# Patient Record
Sex: Female | Born: 1960
Health system: Southern US, Community
[De-identification: ages and names within clinical notes are randomized; demographics above are authoritative.]

## PROBLEM LIST (undated history)

## (undated) DIAGNOSIS — J45909 Unspecified asthma, uncomplicated: Secondary | ICD-10-CM

---

## 2017-06-04 ENCOUNTER — Other Ambulatory Visit: Payer: Self-pay

## 2017-06-04 ENCOUNTER — Emergency Department (HOSPITAL_BASED_OUTPATIENT_CLINIC_OR_DEPARTMENT_OTHER)
Admission: EM | Admit: 2017-06-04 | Discharge: 2017-06-04 | Disposition: A | Discharge status: Home / Self Care | Payer: 59 | Attending: Emergency Medicine | Admitting: Emergency Medicine

## 2017-06-04 ENCOUNTER — Encounter (HOSPITAL_BASED_OUTPATIENT_CLINIC_OR_DEPARTMENT_OTHER): Payer: Self-pay | Admitting: Emergency Medicine

## 2017-06-04 ENCOUNTER — Emergency Department (HOSPITAL_BASED_OUTPATIENT_CLINIC_OR_DEPARTMENT_OTHER): Payer: 59

## 2017-06-04 DIAGNOSIS — H6982 Other specified disorders of Eustachian tube, left ear: Secondary | ICD-10-CM | POA: Insufficient documentation

## 2017-06-04 DIAGNOSIS — J45909 Unspecified asthma, uncomplicated: Secondary | ICD-10-CM | POA: Insufficient documentation

## 2017-06-04 DIAGNOSIS — F17200 Nicotine dependence, unspecified, uncomplicated: Secondary | ICD-10-CM | POA: Diagnosis not present

## 2017-06-04 DIAGNOSIS — R51 Headache: Secondary | ICD-10-CM | POA: Diagnosis not present

## 2017-06-04 HISTORY — DX: Unspecified asthma, uncomplicated: J45.909

## 2017-06-04 LAB — CBC WITH DIFFERENTIAL/PLATELET
BASOS ABS: 0 10*3/uL (ref 0.0–0.1)
BASOS PCT: 1 %
EOS ABS: 0.2 10*3/uL (ref 0.0–0.7)
Eosinophils Relative: 3 %
HCT: 40 % (ref 36.0–46.0)
Hemoglobin: 13.4 g/dL (ref 12.0–15.0)
Lymphocytes Relative: 33 %
Lymphs Abs: 2.3 10*3/uL (ref 0.7–4.0)
MCH: 30.2 pg (ref 26.0–34.0)
MCHC: 33.5 g/dL (ref 30.0–36.0)
MCV: 90.3 fL (ref 78.0–100.0)
Monocytes Absolute: 0.6 10*3/uL (ref 0.1–1.0)
Monocytes Relative: 9 %
NEUTROS PCT: 54 %
Neutro Abs: 3.8 10*3/uL (ref 1.7–7.7)
Platelets: 271 10*3/uL (ref 150–400)
RBC: 4.43 MIL/uL (ref 3.87–5.11)
RDW: 13.7 % (ref 11.5–15.5)
WBC: 6.9 10*3/uL (ref 4.0–10.5)

## 2017-06-04 LAB — BASIC METABOLIC PANEL
Anion gap: 7 (ref 5–15)
BUN: 11 mg/dL (ref 6–20)
CHLORIDE: 103 mmol/L (ref 101–111)
CO2: 27 mmol/L (ref 22–32)
Calcium: 10.1 mg/dL (ref 8.9–10.3)
Creatinine, Ser: 0.66 mg/dL (ref 0.44–1.00)
GFR calc non Af Amer: >60 mL/min (ref >60)
Glucose, Bld: 78 mg/dL (ref 65–99)
POTASSIUM: 4.3 mmol/L (ref 3.5–5.1)
SODIUM: 137 mmol/L (ref 135–145)

## 2017-06-04 MED ORDER — SODIUM CHLORIDE 0.9 % IV BOLUS
1000.0000 mL | Freq: Once | INTRAVENOUS | Status: AC
  Administered 2017-06-04: 1000 mL via INTRAVENOUS

## 2017-06-04 MED ORDER — PREDNISONE 50 MG PO TABS: ORAL_TABLET | ORAL | 0 refills | Status: AC

## 2017-06-04 MED ORDER — PROCHLORPERAZINE EDISYLATE 5 MG/ML IJ SOLN
10.0000 mg | Freq: Once | INTRAMUSCULAR | Status: AC
  Administered 2017-06-04: 10 mg via INTRAVENOUS
  Filled 2017-06-04: qty 2

## 2017-06-04 MED ORDER — DEXAMETHASONE SODIUM PHOSPHATE 10 MG/ML IJ SOLN
10.0000 mg | Freq: Once | INTRAMUSCULAR | Status: AC
  Administered 2017-06-04: 10 mg via INTRAVENOUS
  Filled 2017-06-04: qty 1

## 2017-06-04 MED FILL — predniSONE 50 MG TABS: 50 | 5 days supply | Qty: 5 | Fill #0

## 2017-06-04 NOTE — Discharge Instructions (Addendum)
You can use Flonase nasal spray which is avilable over the counter. Use nasal saline (you can try [employer] Simply Saline) at least 4 times a day, use saline 5-10 minutes before using the fluticasone (flonase) nasal spray ° °Do not use Afrin (Oxymetazoline) ° °Rest, wash hands frequently  and drink plenty of water. ° °You may try over the counter medication such as allegra-decongestant or claritin-decongestant and/or Mucinex or decongestants. ° °Push fluids to try to thin the mucus and get it to flow out the nose.  ° °

## 2017-06-04 NOTE — ED Provider Notes (Signed)
[facility] EMERGENCY DEPARTMENT Provider Note   CSN: [id number] Arrival date & time: 06/04/17  4098     History   Chief Complaint Chief Complaint  Patient presents with  . Otalgia    HPI   Blood pressure (!) 142/82, pulse 91, temperature 98.7 F (37.1 C), temperature source Oral, resp. rate 18, height 5' (1.524 m), weight 72.6 kg (160 lb), last menstrual period 06/04/2016, SpO2 100 %.  Carol Marshall is a 57 y.o. female with past medical history significant for asthma, visiting her mother from [place] complaining of a headache onset 2 weeks ago she states that initially it was more occipital, it was indolent in onset with no thunderclap.  She states that she has had this pain for 2 weeks but she states that now it is more near the left ear and radiating down, she denies any fever, chills, change in vision, dysarthria, ataxia, rhinorrhea, nasal congestion, sore throat.  She does not typically have headaches, she is taking 800 mg of ibuprofen at home with little relief.  Past Medical History:  Diagnosis Date  . Asthma     There are no active problems to display for this patient.   History reviewed. No pertinent surgical history.   OB History   None      Home Medications    Prior to Admission medications   Medication Sig Start Date End Date Taking? Authorizing Provider  predniSONE (DELTASONE) 50 MG tablet Take 1 tablet daily with breakfast 06/04/17   Shakim Faith, Joni Reining, PA-C    Family History No family history on file.  Social History Social History   Tobacco Use  . Smoking status: Current Every Day Smoker  . Smokeless tobacco: Never Used  Substance Use Topics  . Alcohol use: Never    Frequency: Never  . Drug use: Never     Allergies   Patient has no known allergies.   Review of Systems Review of Systems  A complete review of systems was obtained and all systems are negative except as noted in the HPI and PMH.   Physical Exam Updated Vital  Signs BP (!) 142/82 (BP Location: Right Arm)   Pulse 91   Temp 98.7 F (37.1 C) (Oral)   Resp 18   Ht 5' (1.524 m)   Wt 72.6 kg (160 lb)   LMP 06/04/2016   SpO2 100%   BMI 31.25 kg/m   Physical Exam  Constitutional: She is oriented to person, place, and time. She appears well-developed and well-nourished.  HENT:  Head: Normocephalic and atraumatic.  Right Ear: External ear normal.  Left Ear: External ear normal.  Mouth/Throat: Oropharynx is clear and moist. No oropharyngeal exudate.  No drooling or stridor. Posterior pharynx mildly erythematous no significant tonsillar hypertrophy. No exudate. Soft palate rises symmetrically. No TTP or induration under tongue.   No tenderness to palpation of frontal or bilateral maxillary sinuses.  Mild mucosal edema in the nares with scant rhinorrhea.  Bilateral tympanic membranes with normal architecture and good light reflex.    Eyes: Pupils are equal, round, and reactive to light. Conjunctivae and EOM are normal.  No TTP of maxillary or frontal sinuses  No TTP or induration of temporal arteries bilaterally  Neck: Normal range of motion. Neck supple.  FROM to C-spine. Pt can touch chin to chest without discomfort. No TTP of midline cervical spine.   Cardiovascular: Normal rate, regular rhythm and intact distal pulses.  Pulmonary/Chest: Effort normal and breath sounds normal. No stridor.  No respiratory distress. She has no wheezes. She has no rales. She exhibits no tenderness.  Abdominal: Soft. Bowel sounds are normal. There is no tenderness. There is no rebound and no guarding.  Musculoskeletal: Normal range of motion. She exhibits no edema or tenderness.  Neurological: She is alert and oriented to person, place, and time. No cranial nerve deficit.  II-Visual fields grossly intact. III/IV/VI-Extraocular movements intact.  Pupils reactive bilaterally. V/VII-Smile symmetric, equal eyebrow raise,  facial sensation intact VIII- Hearing  grossly intact IX/X-Normal gag XI-bilateral shoulder shrug XII-midline tongue extension Motor: 5/5 bilaterally with normal tone and bulk Cerebellar: Normal finger-to-nose  and normal heel-to-shin test.   Romberg negative Ambulates with a coordinated gait   Nursing note and vitals reviewed.    ED Treatments / Results  Labs (all labs ordered are listed, but only abnormal results are displayed) Labs Reviewed  CBC WITH DIFFERENTIAL/PLATELET  BASIC METABOLIC PANEL    EKG None  Radiology Ct Head Wo Contrast  Result Date: 06/04/2017 CLINICAL DATA:  Acute headache, worsening.  No reported injury. EXAM: CT HEAD WITHOUT CONTRAST TECHNIQUE: Contiguous axial images were obtained from the base of the skull through the vertex without intravenous contrast. COMPARISON:  None. FINDINGS: Brain: No evidence of parenchymal hemorrhage or extra-axial fluid collection. No mass lesion, mass effect, or midline shift. No CT evidence of acute infarction. Cerebral volume is age appropriate. No ventriculomegaly. Vascular: No acute abnormality. Skull: No evidence of calvarial fracture. Sinuses/Orbits: The visualized paranasal sinuses are essentially clear. Other:  The mastoid air cells are unopacified. IMPRESSION: Negative head CT.  No evidence of acute intracranial abnormality. Electronically Signed   By: Delbert Phenix M.D.   On: 06/04/2017 11:48    Procedures Procedures (including critical care time)  Medications Ordered in ED Medications  dexamethasone (DECADRON) injection 10 mg (10 mg Intravenous Given 06/04/17 1215)  prochlorperazine (COMPAZINE) injection 10 mg (10 mg Intravenous Given 06/04/17 1215)  sodium chloride 0.9 % bolus 1,000 mL (1,000 mLs Intravenous New Bag/Given 06/04/17 1214)     Initial Impression / Assessment and Plan / ED Course  I have reviewed the triage vital signs and the nursing notes.  Pertinent labs & imaging results that were available during my care of the patient were  reviewed by me and considered in my medical decision making (see chart for details).     Vitals:   06/04/17 1000  BP: (!) 142/82  Pulse: 91  Resp: 18  Temp: 98.7 F (37.1 C)  TempSrc: Oral  SpO2: 100%  Weight: 72.6 kg (160 lb)  Height: 5' (1.524 m)    Medications  dexamethasone (DECADRON) injection 10 mg (10 mg Intravenous Given 06/04/17 1215)  prochlorperazine (COMPAZINE) injection 10 mg (10 mg Intravenous Given 06/04/17 1215)  sodium chloride 0.9 % bolus 1,000 mL (1,000 mLs Intravenous New Bag/Given 06/04/17 1214)    Persephanie Laatsch is 57 y.o. female presenting with headache onset 2 weeks ago it has settled is a pain in the left ear which radiates down the left lateral neck.  She has a nonfocal neurologic exam, HEENT exam grossly normal.  This may be secondary to a eustachian tube dysfunction, given the atypical nature of her headache will obtain CT, headache cocktail including Decadron administered.  We will also check basic blood work.  CT head negative, blood work reassuring, she got some mild relief with headache cocktail.  Will treat for eustachian tube dysfunction.  Evaluation does not show pathology that would require ongoing emergent intervention or inpatient treatment.  Pt is hemodynamically stable and mentating appropriately. Discussed findings and plan with patient/guardian, who agrees with care plan. All questions answered. Return precautions discussed and outpatient follow up given.    Final Clinical Impressions(s) / ED Diagnoses   Final diagnoses:  Eustachian tube dysfunction, left    ED Discharge Orders        Ordered    predniSONE (DELTASONE) 50 MG tablet     06/04/17 1252       Breiona Couvillon, Mardella Laymanicole, PA-C 06/04/17 1255    Rolland PorterJames, Mark, MD 06/05/17 [phone]

## 2017-06-04 NOTE — ED Triage Notes (Addendum)
Pt presents with c/o left ear pain for about 2 weeks. PT reports headache and facial pain.  Pt reports no relief with OTC meds.

## 2020-04-12 IMAGING — CT CT HEAD W/O CM
3 series · 16 of 47 positions shown, 19 images · non-contrast
Comparison: None.

CLINICAL DATA: Acute headache, worsening.  No reported injury.

EXAM:
CT HEAD WITHOUT CONTRAST
TECHNIQUE: Contiguous axial images were obtained from the base of the skull
through the vertex without intravenous contrast.

[Series 2: head wo · axial · 0.44mm/px · z∈[-187,-52]mm · 10 of 33 slices shown, 13 images]
[im 3/33  brain]
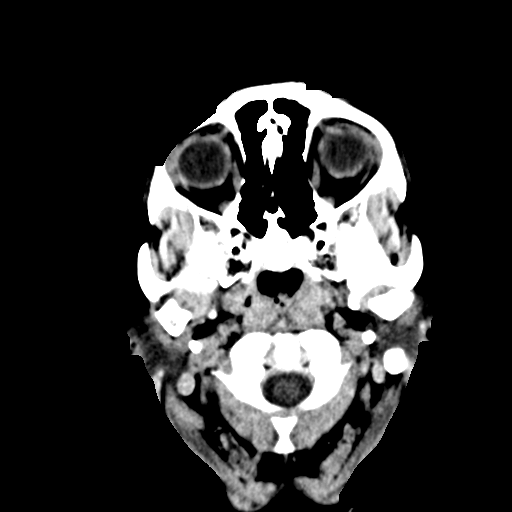
[im 3/33  bone]
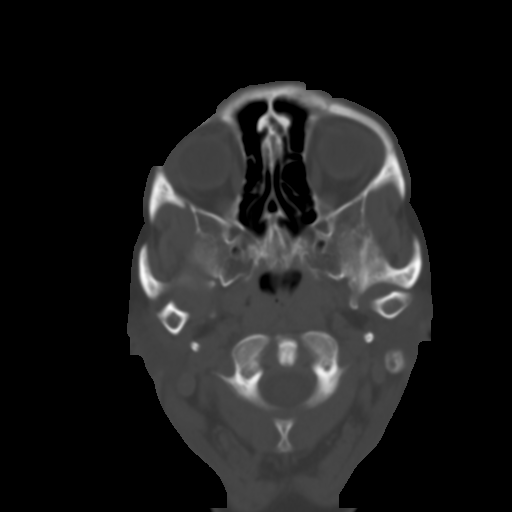
[im 6/33  brain]
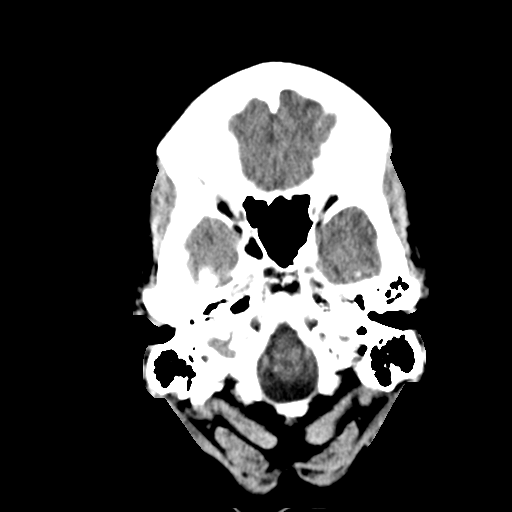
[im 9/33  brain]
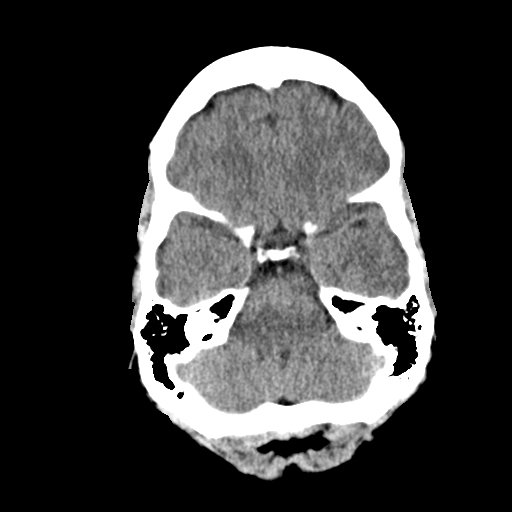
[im 12/33  brain]
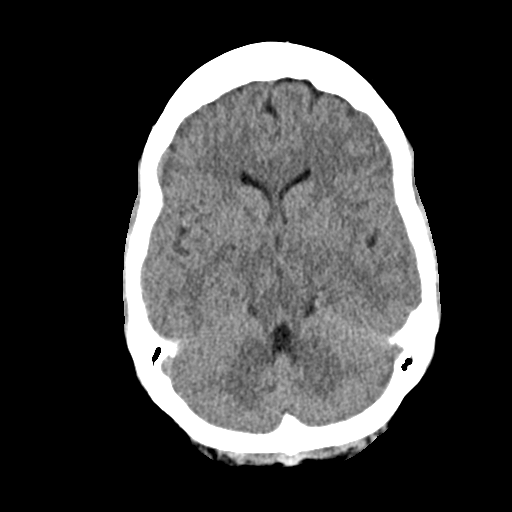
[im 15/33  brain]
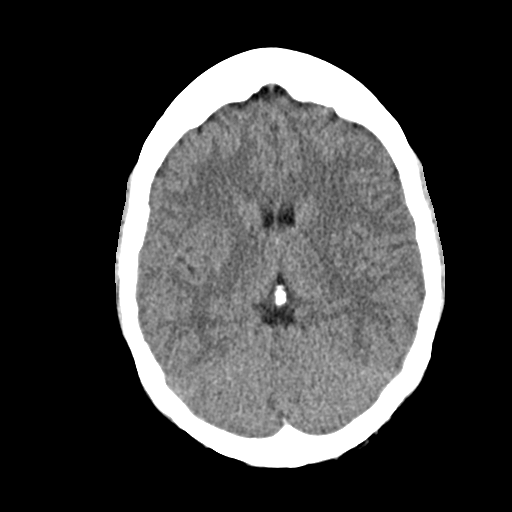
[im 15/33  bone]
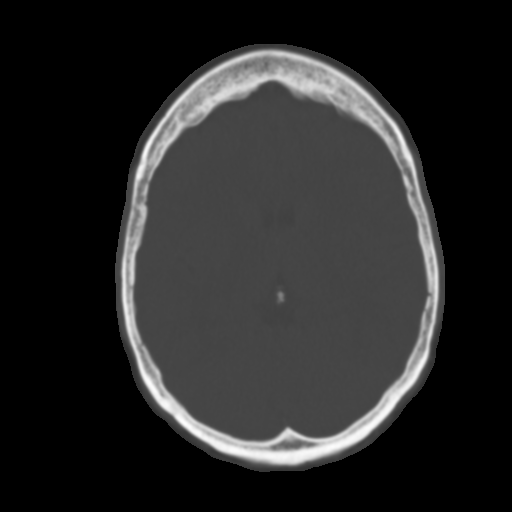
[im 18/33  brain]
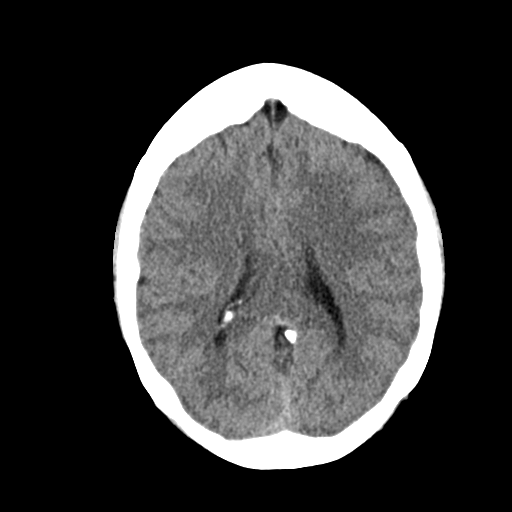
[im 21/33  brain]
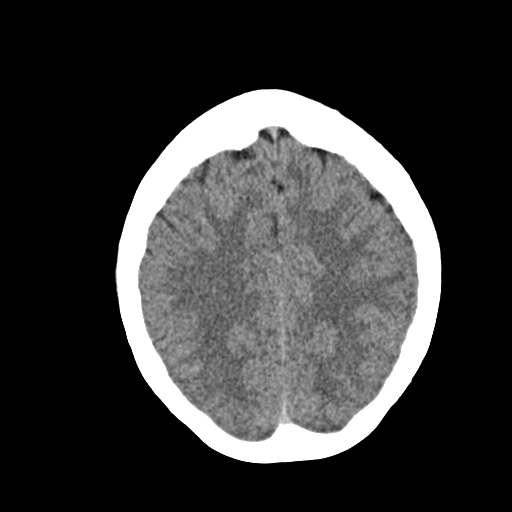
[im 25/33  brain]
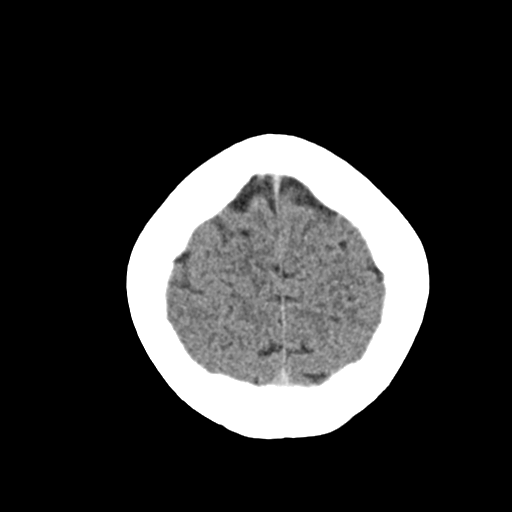
[im 27/33  brain]
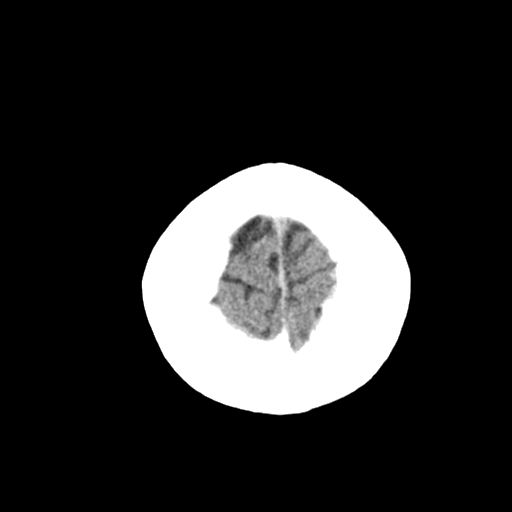
[im 27/33  bone]
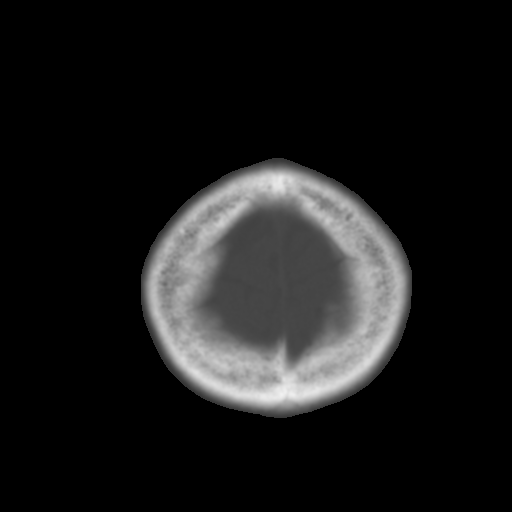
[im 30/33  brain]
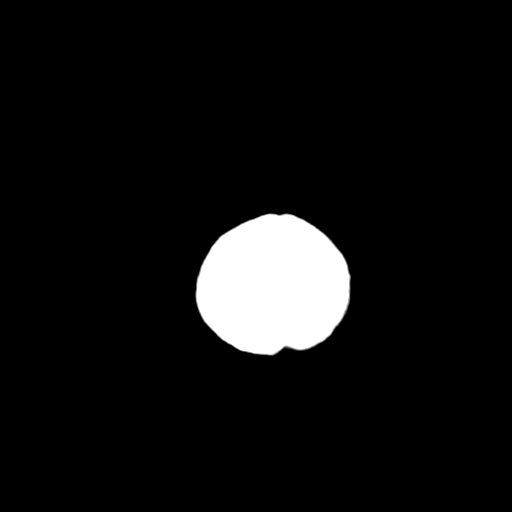

[Series 4: coronal soft · coronal · 0.33mm/px · 3 of 68 slices shown]
[im 23/68  brain]
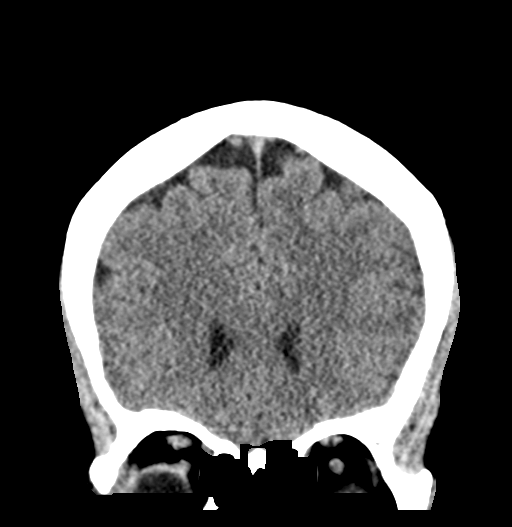
[im 30/68  brain]
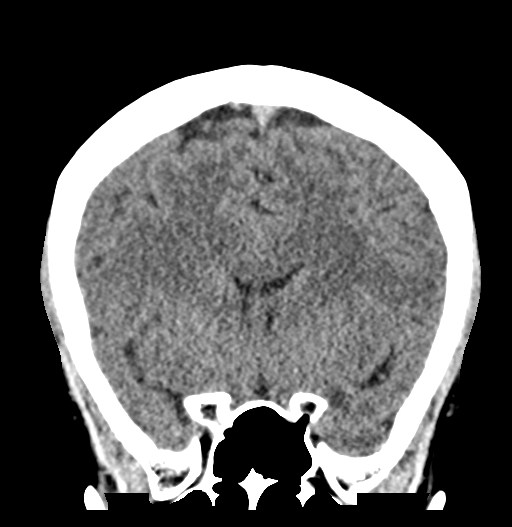
[im 38/68  brain]
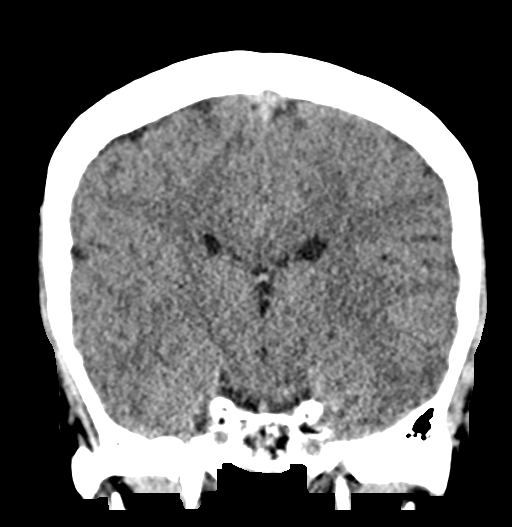

[Series 5: sag soft · sagittal · 0.34mm/px · 3 of 52 slices shown]
[im 18/52  brain]
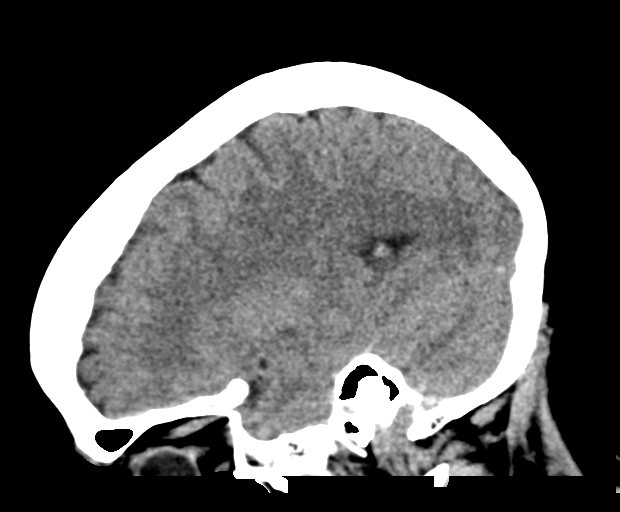
[im 26/52  brain]
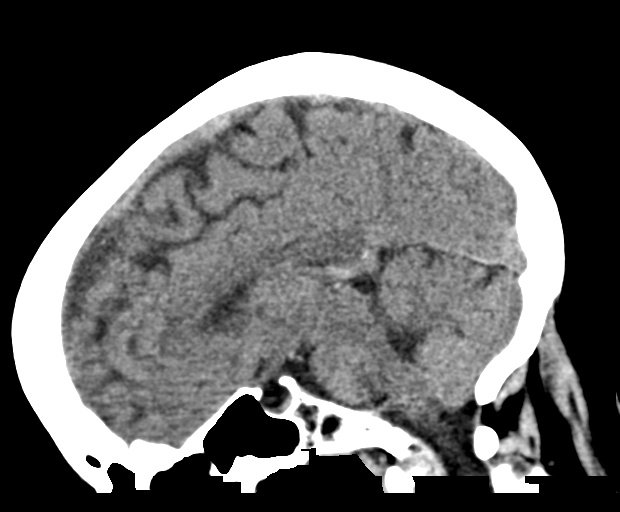
[im 35/52  brain]
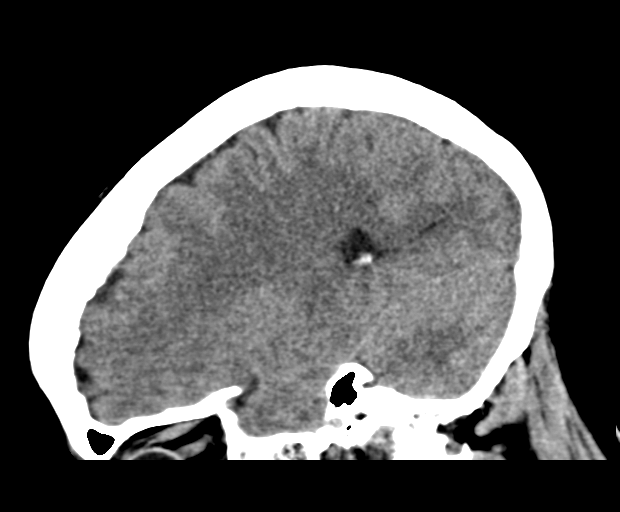

[16 of 47 positions shown; findings below may reference images not displayed]

FINDINGS: Brain: No evidence of parenchymal hemorrhage or extra-axial fluid
collection. No mass lesion, mass effect, or midline shift. No CT
evidence of acute infarction. Cerebral volume is age appropriate. No
ventriculomegaly.

Vascular: No acute abnormality.

Skull: No evidence of calvarial fracture.

Sinuses/Orbits: The visualized paranasal sinuses are essentially
clear.

Other:  The mastoid air cells are unopacified.
IMPRESSION: Negative head CT.  No evidence of acute intracranial abnormality.
